# Patient Record
Sex: Female | Born: 1975 | Race: White | Hispanic: No | Marital: Married | Smoking: Never smoker
Health system: Southern US, Community
[De-identification: ages and names within clinical notes are randomized; demographics above are authoritative.]

## PROBLEM LIST (undated history)

## (undated) DIAGNOSIS — R87629 Unspecified abnormal cytological findings in specimens from vagina: Secondary | ICD-10-CM

## (undated) DIAGNOSIS — N8003 Adenomyosis of the uterus: Secondary | ICD-10-CM

## (undated) DIAGNOSIS — N83209 Unspecified ovarian cyst, unspecified side: Secondary | ICD-10-CM

## (undated) DIAGNOSIS — N800 Endometriosis of uterus: Secondary | ICD-10-CM

## (undated) DIAGNOSIS — J45909 Unspecified asthma, uncomplicated: Secondary | ICD-10-CM

## (undated) HISTORY — PX: KNEE SURGERY: SHX244

## (undated) HISTORY — DX: Endometriosis of uterus: N80.0

## (undated) HISTORY — DX: Unspecified abnormal cytological findings in specimens from vagina: R87.629

## (undated) HISTORY — PX: OTHER SURGICAL HISTORY: SHX169

## (undated) HISTORY — DX: Unspecified asthma, uncomplicated: J45.909

## (undated) HISTORY — DX: Adenomyosis of the uterus: N80.03

## (undated) HISTORY — DX: Unspecified ovarian cyst, unspecified side: N83.209

## (undated) HISTORY — PX: LAPAROSCOPIC HYSTERECTOMY: SHX1926

## (undated) HISTORY — PX: TUBAL LIGATION: SHX77

## (undated) HISTORY — PX: ENDOMETRIAL ABLATION: SHX621

---

## 2015-09-16 DIAGNOSIS — G4700 Insomnia, unspecified: Secondary | ICD-10-CM | POA: Insufficient documentation

## 2015-09-16 DIAGNOSIS — F419 Anxiety disorder, unspecified: Secondary | ICD-10-CM | POA: Insufficient documentation

## 2015-09-16 DIAGNOSIS — F458 Other somatoform disorders: Secondary | ICD-10-CM | POA: Insufficient documentation

## 2015-09-16 DIAGNOSIS — G4750 Parasomnia, unspecified: Secondary | ICD-10-CM | POA: Insufficient documentation

## 2015-09-16 DIAGNOSIS — F515 Nightmare disorder: Secondary | ICD-10-CM | POA: Insufficient documentation

## 2015-10-05 DIAGNOSIS — G4733 Obstructive sleep apnea (adult) (pediatric): Secondary | ICD-10-CM | POA: Insufficient documentation

## 2018-03-02 DIAGNOSIS — M7581 Other shoulder lesions, right shoulder: Secondary | ICD-10-CM | POA: Insufficient documentation

## 2020-07-19 ENCOUNTER — Encounter: Payer: Self-pay | Admitting: Obstetrics and Gynecology

## 2020-07-19 ENCOUNTER — Other Ambulatory Visit: Payer: Self-pay

## 2020-07-19 ENCOUNTER — Ambulatory Visit (INDEPENDENT_AMBULATORY_CARE_PROVIDER_SITE_OTHER): Payer: BLUE CROSS/BLUE SHIELD | Admitting: Obstetrics and Gynecology

## 2020-07-19 VITALS — BP 121/69 | HR 78 | Resp 16 | Ht 66.0 in | Wt 182.0 lb

## 2020-07-19 DIAGNOSIS — R102 Pelvic and perineal pain: Secondary | ICD-10-CM

## 2020-07-19 DIAGNOSIS — Z23 Encounter for immunization: Secondary | ICD-10-CM

## 2020-07-19 DIAGNOSIS — Z1231 Encounter for screening mammogram for malignant neoplasm of breast: Secondary | ICD-10-CM

## 2020-07-19 NOTE — Progress Notes (Signed)
GYNECOLOGY OFFICE VISIT NOTE  History:  44 y.o. G3P2010 here today for throbbing in vaginal area for 3 weeks. Comes and goes, has sometimes been so bad to wake her out of sleep. Mostly is a 5/10. Has not tried to take anything for it. Has not seen anyone for it. Never had pain liek this before and this is very different than her cyst pain. Irregular bowel movements, has always had trouble of constipation, no issues with voiding.  Past Medical History:  Diagnosis Date  . Adenomyosis   . Asthma   . Ovarian cyst   . Vaginal Pap smear, abnormal     Past Surgical History:  Procedure Laterality Date  . ENDOMETRIAL ABLATION    . hysterscopy    . KNEE SURGERY    . LAPAROSCOPIC HYSTERECTOMY    . lumpectomy    . TUBAL LIGATION       Current Outpatient Medications:  .  montelukast (SINGULAIR) 10 MG tablet, Take 10 mg by mouth at bedtime., Disp: , Rfl:  .  montelukast (SINGULAIR) 10 MG tablet, Take by mouth., Disp: , Rfl:  .  prazosin (MINIPRESS) 2 MG capsule, Take by mouth., Disp: , Rfl:  .  TRINTELLIX 20 MG TABS tablet, Take 20 mg by mouth daily., Disp: , Rfl:   The following portions of the patient's history were reviewed and updated as appropriate: allergies, current medications, past family history, past medical history, past social history, past surgical history and problem list.   Review of Systems:  Pertinent items noted in HPI and remainder of comprehensive ROS otherwise negative.   Objective:  Physical Exam BP 121/69   Pulse 78   Resp 16   Ht 5\' 6"  (1.676 m)   Wt 182 lb (82.6 kg)   BMI 29.38 kg/m  CONSTITUTIONAL: Well-developed, well-nourished female in no acute distress.  HENT:  Normocephalic, atraumatic. External right and left ear normal. Oropharynx is clear and moist EYES: Conjunctivae and EOM are normal. Pupils are equal, round, and reactive to light. No scleral icterus.  NECK: Normal range of motion, supple, no masses SKIN: Skin is warm and dry. No rash noted.  Not diaphoretic. No erythema. No pallor. NEUROLOGIC: Alert and oriented to person, place, and time. Normal reflexes, muscle tone coordination. No cranial nerve deficit noted. PSYCHIATRIC: Normal mood and affect. Normal behavior. Normal judgment and thought content. CARDIOVASCULAR: Normal heart rate noted RESPIRATORY: Effort normal, no problems with respiration noted ABDOMEN: Soft, no distention noted.   PELVIC: Normal appearing external genitalia; normal appearing vaginal mucosa.  No  Areas of tenderness, no evidence of infection, pt indicates left peri-perineal area with emphasis on more superficial aspect approx 2 cm from introitus at 5 o'clock as where pain is, no palpable areas of tenderness or masses MUSCULOSKELETAL: Normal range of motion. No edema noted.  Exam done with chaperone present.  Labs and Imaging No results found.  Assessment & Plan:  1. Vulvar pain Suspect bartholins cyst that is enlarging and regressing given localization of pain Recommend sitz baths and regular ibuprofen to see if it improves as there is nothing notable on exam - differential includes varicose veins as well, possible congestion in area - if no improvement, will consider imaging  2. Encounter for screening mammogram for malignant neoplasm of breast - MM 3D SCREEN BREAST BILATERAL; Future   Routine preventative health maintenance measures emphasized. Please refer to After Visit Summary for other counseling recommendations.   Return in about 3 months (around 10/18/2020) for annual.  Total face-to-face time with patient: 22 minutes. Over 50% of encounter was spent on counseling and coordination of care.   Baldemar Lenis, M.D. [facility]ending [facility])

## 2020-07-26 ENCOUNTER — Ambulatory Visit (INDEPENDENT_AMBULATORY_CARE_PROVIDER_SITE_OTHER): Payer: BLUE CROSS/BLUE SHIELD

## 2020-07-26 ENCOUNTER — Other Ambulatory Visit: Payer: Self-pay

## 2020-07-26 DIAGNOSIS — Z1231 Encounter for screening mammogram for malignant neoplasm of breast: Secondary | ICD-10-CM | POA: Diagnosis not present

## 2020-08-15 ENCOUNTER — Encounter: Payer: Self-pay | Admitting: *Deleted

## 2020-10-25 ENCOUNTER — Other Ambulatory Visit: Payer: Self-pay

## 2020-10-25 ENCOUNTER — Encounter: Payer: Self-pay | Admitting: Obstetrics and Gynecology

## 2020-10-25 ENCOUNTER — Ambulatory Visit (INDEPENDENT_AMBULATORY_CARE_PROVIDER_SITE_OTHER): Payer: BLUE CROSS/BLUE SHIELD | Admitting: Obstetrics and Gynecology

## 2020-10-25 VITALS — BP 126/87 | HR 71 | Ht 66.0 in | Wt 186.0 lb

## 2020-10-25 DIAGNOSIS — R102 Pelvic and perineal pain: Secondary | ICD-10-CM | POA: Diagnosis not present

## 2020-10-25 DIAGNOSIS — N951 Menopausal and female climacteric states: Secondary | ICD-10-CM | POA: Diagnosis not present

## 2020-10-25 DIAGNOSIS — Z01419 Encounter for gynecological examination (general) (routine) without abnormal findings: Secondary | ICD-10-CM | POA: Diagnosis not present

## 2020-10-25 NOTE — Progress Notes (Signed)
Last mam- 07/26/20- normal

## 2020-10-25 NOTE — Patient Instructions (Signed)
Menopause Menopause is the normal time of life when menstrual periods stop completely. It is usually confirmed by 12 months without a menstrual period. The transition to menopause (perimenopause) most often happens between the ages of 45 and 55. During perimenopause, hormone levels change in your body, which can cause symptoms and affect your health. Menopause may increase your risk for:  Loss of bone (osteoporosis), which causes bone breaks (fractures).  Depression.  Hardening and narrowing of the arteries (atherosclerosis), which can cause heart attacks and strokes. What are the causes? This condition is usually caused by a natural change in hormone levels that happens as you get older. The condition may also be caused by surgery to remove both ovaries (bilateral oophorectomy). What increases the risk? This condition is more likely to start at an earlier age if you have certain medical conditions or treatments, including:  A tumor of the pituitary gland in the brain.  A disease that affects the ovaries and hormone production.  Radiation treatment for cancer.  Certain cancer treatments, such as chemotherapy or hormone (anti-estrogen) therapy.  Heavy smoking and excessive alcohol use.  Family history of early menopause. This condition is also more likely to develop earlier in women who are very thin. What are the signs or symptoms? Symptoms of this condition include:  Hot flashes.  Irregular menstrual periods.  Night sweats.  Changes in feelings about sex. This could be a decrease in sex drive or an increased comfort around your sexuality.  Vaginal dryness and thinning of the vaginal walls. This may cause painful intercourse.  Dryness of the skin and development of wrinkles.  Headaches.  Problems sleeping (insomnia).  Mood swings or irritability.  Memory problems.  Weight gain.  Hair growth on the face and chest.  Bladder infections or problems with urinating. How  is this diagnosed? This condition is diagnosed based on your medical history, a physical exam, your age, your menstrual history, and your symptoms. Hormone tests may also be done. How is this treated? In some cases, no treatment is needed. You and your health care provider should make a decision together about whether treatment is necessary. Treatment will be based on your individual condition and preferences. Treatment for this condition focuses on managing symptoms. Treatment may include:  Menopausal hormone therapy (MHT).  Medicines to treat specific symptoms or complications.  Acupuncture.  Vitamin or herbal supplements. Before starting treatment, make sure to let your health care provider know if you have a personal or family history of:  Heart disease.  Breast cancer.  Blood clots.  Diabetes.  Osteoporosis. Follow these instructions at home: Lifestyle  Do not use any products that contain nicotine or tobacco, such as cigarettes and e-cigarettes. If you need help quitting, ask your health care provider.  Get at least 30 minutes of physical activity on 5 or more days each week.  Avoid alcoholic and caffeinated beverages, as well as spicy foods. This may help prevent hot flashes.  Get 7-8 hours of sleep each night.  If you have hot flashes, try: ? Dressing in layers. ? Avoiding things that may trigger hot flashes, such as spicy food, warm places, or stress. ? Taking slow, deep breaths when a hot flash starts. ? Keeping a fan in your home and office.  Find ways to manage stress, such as deep breathing, meditation, or journaling.  Consider going to group therapy with other women who are having menopause symptoms. Ask your health care provider about recommended group therapy meetings. Eating and   drinking  Eat a healthy, balanced diet that contains whole grains, lean protein, low-fat dairy, and plenty of fruits and vegetables.  Your health care provider may recommend  adding more soy to your diet. Foods that contain soy include tofu, tempeh, and soy milk.  Eat plenty of foods that contain calcium and vitamin D for bone health. Items that are rich in calcium include low-fat milk, yogurt, beans, almonds, sardines, broccoli, and kale. Medicines  Take over-the-counter and prescription medicines only as told by your health care provider.  Talk with your health care provider before starting any herbal supplements. If prescribed, take vitamins and supplements as told by your health care provider. These may include: ? Calcium. Women age 51 and older should get 1,200 mg (milligrams) of calcium every day. ? Vitamin D. Women need 600-800 International Units of vitamin D each day. ? Vitamins B12 and B6. Aim for 50 micrograms of B12 and 1.5 mg of B6 each day. General instructions  Keep track of your menstrual periods, including: ? When they occur. ? How heavy they are and how long they last. ? How much time passes between periods.  Keep track of your symptoms, noting when they start, how often you have them, and how long they last.  Use vaginal lubricants or moisturizers to help with vaginal dryness and improve comfort during sex.  Keep all follow-up visits as told by your health care provider. This is important. This includes any group therapy or counseling. Contact a health care provider if:  You are still having menstrual periods after age 55.  You have pain during sex.  You have not had a period for 12 months and you develop vaginal bleeding. Get help right away if:  You have: ? Severe depression. ? Excessive vaginal bleeding. ? Pain when you urinate. ? A fast or irregular heart beat (palpitations). ? Severe headaches. ? Abdomen (abdominal) pain or severe indigestion.  You fell and you think you have a broken bone.  You develop leg or chest pain.  You develop vision problems.  You feel a lump in your breast. Summary  Menopause is the normal  time of life when menstrual periods stop completely. It is usually confirmed by 12 months without a menstrual period.  The transition to menopause (perimenopause) most often happens between the ages of 45 and 55.  Symptoms can be managed through medicines, lifestyle changes, and complementary therapies such as acupuncture.  Eat a balanced diet that is rich in nutrients to promote bone health and heart health and to manage symptoms during menopause. This information is not intended to replace advice given to you by your health care provider. Make sure you discuss any questions you have with your health care provider. Document Revised: 10/02/2017 Document Reviewed: 11/22/2016 Elsevier Patient Education  2020 Elsevier Inc.  

## 2020-10-25 NOTE — Progress Notes (Signed)
GYNECOLOGY ANNUAL PREVENTATIVE CARE ENCOUNTER NOTE  Subjective:   Suzanne Valdez is a 44 y.o. G64P2010 female here for a annual gynecologic exam. Current complaints: right sided throbbing, 3-4 days out of month, less frequent than before but still present. Vulvar pain improved.    Denies abnormal vaginal bleeding, discharge, problems with intercourse or other gynecologic concerns. Declines STI screen.   Gynecologic History No LMP recorded. Patient has had a hysterectomy. Contraception: status post hysterectomy Last Pap: 2018. Results: normal Last mammogram: 9/21. Results: Birads 1 DEXA: has never had  Obstetric History OB History  Gravida Para Term Preterm AB Living  3 2 2   1     SAB IAB Ectopic Multiple Live Births    1     2    # Outcome Date GA Lbr Len/2nd Weight Sex Delivery Anes PTL Lv  3 IAB           2 Term      Vag-Spont     1 Term      Vag-Spont       Past Medical History:  Diagnosis Date  . Adenomyosis   . Asthma   . Ovarian cyst   . Vaginal Pap smear, abnormal     Past Surgical History:  Procedure Laterality Date  . ENDOMETRIAL ABLATION    . hysterscopy    . KNEE SURGERY    . LAPAROSCOPIC HYSTERECTOMY    . lumpectomy    . TUBAL LIGATION      Current Outpatient Medications on File Prior to Visit  Medication Sig Dispense Refill  . montelukast (SINGULAIR) 10 MG tablet Take 10 mg by mouth at bedtime.    . montelukast (SINGULAIR) 10 MG tablet Take by mouth.    . prazosin (MINIPRESS) 2 MG capsule Take by mouth.    . TRINTELLIX 20 MG TABS tablet Take 20 mg by mouth daily.     No current facility-administered medications on file prior to visit.    Allergies  Allergen Reactions  . Benzoin Other (See Comments) and Rash    unknown unknown unknown unknown unknown unknown unknown unknown   . Latex Rash    ALSO TAPES  ALSO TAPES ALSO TAPES ALSO TAPES ALSO TAPES  ALSO TAPES   . Morphine Other (See Comments) and Rash    Patient sts that it  doesn't help at all with pain Patient sts that it doesn't help at all with pain Patient sts that it doesn't help at all with pain  Patient sts that it doesn't help at all with pain Patient sts that it doesn't help at all with pain Patient sts that it doesn't help at all with pain Patient sts that it doesn't help at all with pain  Patient sts that it doesn't help at all with pain   . Other Other (See Comments) and Rash    Any surgical adhesive  Paper tape, band aids Paper tape, band aids  Any surgical adhesive  Paper tape, band aids Reaction: Blister Paper tape, band aids Reaction: Blister Paper tape, band aids Reaction: Blister Any surgical adhesive  Paper tape, band aids Paper tape, band aids  Any surgical adhesive  Paper tape, band aids Reaction: Blister   . Povidone Iodine Rash  . Silver Rash and Swelling  . Titanium Rash and Swelling  . Lisdexamfetamine Anxiety    And increase in HR.   . Trazodone And Nefazodone Other (See Comments) and Rash    Other reaction(s): Other (See Comments) Other  reaction(s): Confusion     Social History   Socioeconomic History  . Marital status: Married    Spouse name: Not on file  . Number of children: Not on file  . Years of education: Not on file  . Highest education level: Not on file  Occupational History  . Not on file  Tobacco Use  . Smoking status: Never Smoker  . Smokeless tobacco: Never Used  Vaping Use  . Vaping Use: Never used  Substance and Sexual Activity  . Alcohol use: Yes  . Drug use: Not Currently  . Sexual activity: Yes    Partners: Male    Birth control/protection: Surgical  Other Topics Concern  . Not on file  Social History Narrative  . Not on file   Social Determinants of Health   Financial Resource Strain: Not on file  Food Insecurity: Not on file  Transportation Needs: Not on file  Physical Activity: Not on file  Stress: Not on file  Social Connections: Not on file  Intimate Partner  Violence: Not on file    Family History  Problem Relation Age of Onset  . Alcohol abuse Paternal Grandmother   . Lung cancer Paternal Grandmother   . Alcohol abuse Paternal Grandfather   . Diabetes Maternal Grandfather    The following portions of the patient's history were reviewed and updated as appropriate: allergies, current medications, past family history, past medical history, past social history, past surgical history and problem list.  Review of Systems Pertinent items are noted in HPI.   Objective:  BP 126/87   Pulse 71   Ht 5\' 6"  (1.676 m)   Wt 186 lb (84.4 kg)   BMI 30.02 kg/m  CONSTITUTIONAL: Well-developed, well-nourished female in no acute distress.  HENT:  Normocephalic, atraumatic, External right and left ear normal. Oropharynx is clear and moist EYES: Conjunctivae and EOM are normal. Pupils are equal, round, and reactive to light. No scleral icterus.  NECK: Normal range of motion, supple, no masses.  Normal thyroid.  SKIN: Skin is warm and dry. No rash noted. Not diaphoretic. No erythema. No pallor. NEUROLOGIC: Alert and oriented to person, place, and time. Normal reflexes, muscle tone coordination. No cranial nerve deficit noted. PSYCHIATRIC: Normal mood and affect. Normal behavior. Normal judgment and thought content. CARDIOVASCULAR: Normal heart rate noted RESPIRATORY: Effort normal, no problems with respiration noted. BREASTS: deferred ABDOMEN: Soft, o distention noted.  No tenderness, rebound or guarding.  PELVIC: Normal appearing external genitalia; normal appearing vaginal mucosa and cuff.  No abnormal discharge noted.  Uterus surgically absent, right sided adnexal tenderness, no masses palpable, no left sided masses or tenderness MUSCULOSKELETAL: Normal range of motion. No tenderness.  No cyanosis, clubbing, or edema.   Exam done with chaperone present.  Assessment and Plan:   1. Well woman exam Healthy female exam No pap, pt reports she had 3 pap  after hyst (done for adenomyosis) and last two were normal, was told she did not need any further  2. Pelvic pain Left sided tenderness on exam 4-5 days a month of throbbing - check - US PELVIC COMPLETE WITH TRANSVAGINAL; Future  3. Hot flushes, perimenopausal Reviewed menopause, may be starting perimenopause Return if symptoms are affecting quality of life   Encouraged improvement in diet and exercise.  COVID vaccine UTD Declines STI screen. Mammogram UTD Referral for colonoscopy by PCP Flu vaccine declined/UTD DEXA not due based on age  Routine preventative health maintenance measures emphasized. Please refer to After Visit  Summary for other counseling recommendations.     Baldemar Lenis, M.D. [facility]ending [facility])

## 2020-10-29 ENCOUNTER — Other Ambulatory Visit: Payer: Self-pay

## 2020-10-29 ENCOUNTER — Ambulatory Visit (INDEPENDENT_AMBULATORY_CARE_PROVIDER_SITE_OTHER): Payer: BLUE CROSS/BLUE SHIELD

## 2020-10-29 DIAGNOSIS — R102 Pelvic and perineal pain: Secondary | ICD-10-CM

## 2021-09-22 ENCOUNTER — Telehealth: Payer: Self-pay | Admitting: Emergency Medicine

## 2021-09-22 ENCOUNTER — Encounter: Payer: Self-pay | Admitting: Emergency Medicine

## 2021-09-22 ENCOUNTER — Telehealth: Payer: Self-pay

## 2021-09-22 ENCOUNTER — Emergency Department (INDEPENDENT_AMBULATORY_CARE_PROVIDER_SITE_OTHER)
Admission: EM | Admit: 2021-09-22 | Discharge: 2021-09-22 | Disposition: A | Discharge status: Home / Self Care | Payer: BC Managed Care – PPO | Source: Home / Self Care

## 2021-09-22 ENCOUNTER — Other Ambulatory Visit: Payer: Self-pay

## 2021-09-22 DIAGNOSIS — J0100 Acute maxillary sinusitis, unspecified: Secondary | ICD-10-CM | POA: Diagnosis not present

## 2021-09-22 DIAGNOSIS — J3489 Other specified disorders of nose and nasal sinuses: Secondary | ICD-10-CM | POA: Diagnosis not present

## 2021-09-22 MED ORDER — PREDNISONE 20 MG PO TABS: ORAL_TABLET | ORAL | 0 refills | Status: DC

## 2021-09-22 MED ORDER — FLUCONAZOLE 150 MG PO TABS: 150.0000 mg | ORAL_TABLET | ORAL | 0 refills | Status: DC

## 2021-09-22 MED ORDER — CEFDINIR 300 MG PO CAPS: 300.0000 mg | ORAL_CAPSULE | Freq: Two times a day (BID) | ORAL | 0 refills | Status: AC

## 2021-09-22 NOTE — ED Provider Notes (Signed)
[facility]    CSN: [id number] Arrival date & time: 09/22/21  1127      History   Chief Complaint Chief Complaint  Patient presents with   Nasal Congestion    HPI Suzanne Valdez is a 45 y.o. female.   HPI 45 year old female presents with nasal congestion and fatigue for 5 days.  Patient reports testing positive on COVID for COVID-19 on 09/13/2021.  Patient reports asymptomatic while having COVID; however, reports taking pack Paxlovid initially prescribed for her husband..  Reports taking Mucinex and Vicks for current symptoms.  Past Medical History:  Diagnosis Date   Adenomyosis    Asthma    Ovarian cyst    Vaginal Pap smear, abnormal     There are no problems to display for this patient.   Past Surgical History:  Procedure Laterality Date   ENDOMETRIAL ABLATION     hysterscopy     KNEE SURGERY     LAPAROSCOPIC HYSTERECTOMY     lumpectomy     TUBAL LIGATION      OB History     Gravida  3   Para  2   Term  2   Preterm      AB  1   Living         SAB      IAB  1   Ectopic      Multiple      Live Births  2            Home Medications    Prior to Admission medications   Medication Sig Start Date End Date Taking? Authorizing Provider  cefdinir (OMNICEF) 300 MG capsule Take 1 capsule (300 mg total) by mouth 2 (two) times daily for 7 days. 09/22/21 09/29/21 Yes Trevor Iha, FNP  LINZESS 145 MCG CAPS capsule Take 145 mcg by mouth daily. 08/15/21  Yes [provider]  predniSONE (DELTASONE) 20 MG tablet Take 3 tabs PO daily x 5 days. 09/22/21  Yes Trevor Iha, FNP  montelukast (SINGULAIR) 10 MG tablet Take 10 mg by mouth at bedtime. 07/08/20   [provider]  montelukast (SINGULAIR) 10 MG tablet Take by mouth. 01/12/20   [provider]  prazosin (MINIPRESS) 2 MG capsule Take by mouth. 04/19/18   [provider]  TRINTELLIX 20 MG TABS tablet Take 20 mg by mouth daily. 05/01/20   [provider]    Family History Family History  Problem Relation Age of Onset   Alcohol abuse Paternal Grandmother    Lung cancer Paternal Grandmother    Alcohol abuse Paternal Grandfather    Diabetes Maternal Grandfather     Social History Social History   Tobacco Use   Smoking status: Never   Smokeless tobacco: Never  Vaping Use   Vaping Use: Never used  Substance Use Topics   Alcohol use: Yes   Drug use: Not Currently     Allergies   Benzoin, Latex, Morphine, Other, Povidone iodine, Silver, Titanium, Lisdexamfetamine, and Trazodone and nefazodone   Review of Systems Review of Systems  HENT:  Positive for congestion.   Musculoskeletal:  Positive for myalgias.  All other systems reviewed and are negative.   Physical Exam Triage Vital Signs ED Triage Vitals  Enc Vitals Group     BP 09/22/21 1247 123/85     Pulse Rate 09/22/21 1247 69     Resp 09/22/21 1247 16     Temp 09/22/21 1247 98.2 F (36.8 C)  Temp Source 09/22/21 1247 Oral     SpO2 09/22/21 1247 100 %     Weight --      Height --      Head Circumference --      Peak Flow --      Pain Score 09/22/21 1244 1     Pain Loc --      Pain Edu? --      Excl. in GC? --    No data found.  Updated Vital Signs BP 123/85 (BP Location: Right Arm)   Pulse 69   Temp 98.2 F (36.8 C) (Oral)   Resp 16   SpO2 100%    Physical Exam Vitals and nursing note reviewed.  Constitutional:      Appearance: Normal appearance. She is obese.  HENT:     Head: Normocephalic.     Right Ear: Tympanic membrane and external ear normal.     Left Ear: Tympanic membrane and external ear normal.     Ears:     Comments: Moderate eustachian tube dysfunction noted bilaterally    Nose: Congestion present.     Right Sinus: Maxillary sinus tenderness present.     Left Sinus: Maxillary sinus tenderness present.     Comments: Turbinates are erythematous and edematous    Mouth/Throat:     Mouth: Mucous membranes are moist.      Pharynx: Oropharynx is clear.  Eyes:     Extraocular Movements: Extraocular movements intact.     Conjunctiva/sclera: Conjunctivae normal.     Pupils: Pupils are equal, round, and reactive to light.  Cardiovascular:     Rate and Rhythm: Normal rate and regular rhythm.     Pulses: Normal pulses.     Heart sounds: Normal heart sounds.  Pulmonary:     Effort: Pulmonary effort is normal.     Breath sounds: Normal breath sounds.  Musculoskeletal:        General: Normal range of motion.     Cervical back: Normal range of motion and neck supple.  Skin:    General: Skin is warm and dry.  Neurological:     General: No focal deficit present.     Mental Status: She is alert and oriented to person, place, and time.     UC Treatments / Results  Labs (all labs ordered are listed, but only abnormal results are displayed) Labs Reviewed - No data to display  EKG   Radiology No results found.  Procedures Procedures (including critical care time)  Medications Ordered in UC Medications - No data to display  Initial Impression / Assessment and Plan / UC Course  I have reviewed the triage vital signs and the nursing notes.  Pertinent labs & imaging results that were available during my care of the patient were reviewed by me and considered in my medical decision making (see chart for details).     MDM: 1.  Acute maxillary sinusitis-Rx'd Cefdinir; 2.  Sinus pressure-Rx'd prednisone burst. Advised patient to take medication as directed with food to completion.  Advised patient may take Prednisone burst with first dose of Cefdinir for 5 of the next 7 days.  Encouraged patient to increase daily water intake while taking these medications.  Patient discharged home, hemodynamically stable. Final Clinical Impressions(s) / UC Diagnoses   Final diagnoses:  Acute maxillary sinusitis, recurrence not specified  Sinus pressure     Discharge Instructions      Advised patient to take  medication as directed with food  to completion.  Advised patient may take Prednisone burst with first dose of Cefdinir for 5 of the next 7 days.  Encouraged patient to increase daily water intake while taking these medications.     ED Prescriptions     Medication Sig Dispense Auth. Provider   cefdinir (OMNICEF) 300 MG capsule Take 1 capsule (300 mg total) by mouth 2 (two) times daily for 7 days. 14 capsule Trevor Iha, FNP   predniSONE (DELTASONE) 20 MG tablet Take 3 tabs PO daily x 5 days. 15 tablet Trevor Iha, FNP      PDMP not reviewed this encounter.   Trevor Iha, FNP 09/22/21 1415

## 2021-09-22 NOTE — Telephone Encounter (Signed)
Patient was seen today for Sinusitis and was given rx for abx. Patient states she asked for rx for Diflucan. She did not receive the rx of Diflucan at the pharmacy. Can you send a rx for Diflucan to the pharmacy for patient.

## 2021-09-22 NOTE — Discharge Instructions (Addendum)
Advised patient to take medication as directed with food to completion.  Advised patient may take Prednisone burst with first dose of Cefdinir for 5 of the next 7 days.  Encouraged patient to increase daily water intake while taking these medications.

## 2021-09-22 NOTE — ED Triage Notes (Signed)
Patient presents to Urgent Care with complaints of nasal congestion, fatigue since 5 days ago. Patient reports tested positive for Covid on 09/13/21. She was asymptomatic. Now having nasal congestion, fatigued. 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 Taking Mucinex and Vicks for symptoms. Advil and Tylenol.

## 2021-09-22 NOTE — Telephone Encounter (Signed)
Jennings Books, FNP gives new order for Diflucan which is submitted electronically as per his orders. Pt notified.

## 2021-09-29 ENCOUNTER — Emergency Department (INDEPENDENT_AMBULATORY_CARE_PROVIDER_SITE_OTHER)
Admission: EM | Admit: 2021-09-29 | Discharge: 2021-09-29 | Disposition: A | Discharge status: Home / Self Care | Payer: BC Managed Care – PPO | Source: Home / Self Care | Attending: Family Medicine | Admitting: Family Medicine

## 2021-09-29 ENCOUNTER — Other Ambulatory Visit: Payer: Self-pay

## 2021-09-29 DIAGNOSIS — R058 Other specified cough: Secondary | ICD-10-CM | POA: Diagnosis not present

## 2021-09-29 MED ORDER — AEROCHAMBER PLUS FLO-VU MEDIUM MISC
1.0000 | Freq: Once | Status: AC
  Administered 2021-09-29: 10:00:00 1

## 2021-09-29 MED ORDER — ALBUTEROL SULFATE HFA 108 (90 BASE) MCG/ACT IN AERS
1.0000 | INHALATION_SPRAY | Freq: Once | RESPIRATORY_TRACT | Status: AC
  Administered 2021-09-29: 10:00:00 1 via RESPIRATORY_TRACT

## 2021-09-29 MED ORDER — HYDROCOD POLST-CPM POLST ER 10-8 MG/5ML PO SUER: 5.0000 mL | Freq: Two times a day (BID) | ORAL | 0 refills | Status: DC

## 2021-09-29 MED ORDER — PREDNISONE 20 MG PO TABS: 20.0000 mg | ORAL_TABLET | Freq: Two times a day (BID) | ORAL | 0 refills | Status: DC

## 2021-09-29 NOTE — Discharge Instructions (Signed)
Continue the Tessalon Perles 2-3 times a day Add a DM product such as Delsym or Mucinex DM every 12 hours Continue to drink lots of fluids Run a humidifier in your home if you have 1 Take the Tussionex cough medicine for severe coughing spells Use albuterol as needed for coughing spells or shortness of breath Call if not improving by Wednesday

## 2021-09-29 NOTE — ED Provider Notes (Signed)
[facility]    CSN: [id number] Arrival date & time: 09/29/21  3875      History   Chief Complaint Chief Complaint  Patient presents with   Cough   Nasal Congestion    HPI Suzanne Valdez is a 45 y.o. female.   HPI  Patient had COVID.  She had cough and chest congestion and fatigue body aches and fever.  She states that this got somewhat better but she had continuing symptoms so she came in to be seen.  She was diagnosed with sinusitis.  She was given cefdinir, prednisone, and Tessalon.  Prednisone was at 60 mg a day and made her quite irritable.  The cefdinir seemed to make her feel better for a little while but she has cough that is persisting.  She is here today with unremitting cough.  She is exhausted.  She cannot sleep at night.  Her chest hurts from all the coughing.  She feels short of breath.  She does not have underlying lung disease like emphysema or asthma.  Non-smoker.  Is here for assistance with cough control states she is "terrified of pneumonia".  Past Medical History:  Diagnosis Date   Adenomyosis    Asthma    Ovarian cyst    Vaginal Pap smear, abnormal     There are no problems to display for this patient.   Past Surgical History:  Procedure Laterality Date   ENDOMETRIAL ABLATION     hysterscopy     KNEE SURGERY     LAPAROSCOPIC HYSTERECTOMY     lumpectomy     TUBAL LIGATION      OB History     Gravida  3   Para  2   Term  2   Preterm      AB  1   Living         SAB      IAB  1   Ectopic      Multiple      Live Births  2            Home Medications    Prior to Admission medications   Medication Sig Start Date End Date Taking? Authorizing Provider  chlorpheniramine-HYDROcodone (TUSSIONEX PENNKINETIC ER) 10-8 MG/5ML SUER Take 5 mLs by mouth 2 (two) times daily. 09/29/21  Yes Eustace Moore, MD  predniSONE (DELTASONE) 20 MG tablet Take 1 tablet (20 mg total) by mouth 2 (two) times daily with a meal.  09/29/21  Yes Eustace Moore, MD  cefdinir (OMNICEF) 300 MG capsule Take 1 capsule (300 mg total) by mouth 2 (two) times daily for 7 days. 09/22/21 09/29/21  Trevor Iha, FNP  fluconazole (DIFLUCAN) 150 MG tablet Take 1 tablet (150 mg total) by mouth every 3 (three) days. Take one tab if onset of symptoms of yeast infection, then repeat in 3 days. 09/22/21   Trevor Iha, FNP  LINZESS 145 MCG CAPS capsule Take 145 mcg by mouth daily. 08/15/21   [provider]  montelukast (SINGULAIR) 10 MG tablet Take 10 mg by mouth at bedtime. 07/08/20   [provider]  montelukast (SINGULAIR) 10 MG tablet Take by mouth. 01/12/20   [provider]  prazosin (MINIPRESS) 2 MG capsule Take by mouth. 04/19/18   [provider]  predniSONE (DELTASONE) 20 MG tablet Take 3 tabs PO daily x 5 days. 09/22/21   Trevor Iha, FNP  TRINTELLIX 20 MG TABS tablet Take 20 mg by mouth daily. 05/01/20  [provider]    Family History Family History  Problem Relation Age of Onset   Alcohol abuse Paternal Grandmother    Lung cancer Paternal Grandmother    Alcohol abuse Paternal Grandfather    Diabetes Maternal Grandfather     Social History Social History   Tobacco Use   Smoking status: Never   Smokeless tobacco: Never  Vaping Use   Vaping Use: Never used  Substance Use Topics   Alcohol use: Yes   Drug use: Not Currently     Allergies   Benzoin, Latex, Morphine, Other, Povidone iodine, Silver, Titanium, Lisdexamfetamine, and Trazodone and nefazodone   Review of Systems Review of Systems See HPI  Physical Exam Triage Vital Signs ED Triage Vitals  Enc Vitals Group     BP 09/29/21 0833 123/80     Pulse Rate 09/29/21 0833 75     Resp 09/29/21 0833 16     Temp 09/29/21 0833 98.7 F (37.1 C)     Temp Source 09/29/21 0833 Oral     SpO2 09/29/21 0833 98 %     Weight --      Height --      Head Circumference --      Peak Flow --      Pain Score  09/29/21 0837 4     Pain Loc --      Pain Edu? --      Excl. in GC? --    No data found.  Updated Vital Signs BP 123/80 (BP Location: Left Arm)   Pulse 75   Temp 98.7 F (37.1 C) (Oral)   Resp 16   SpO2 98%      Physical Exam Constitutional:      General: She is not in acute distress.    Appearance: She is well-developed. She is ill-appearing.  HENT:     Head: Normocephalic and atraumatic.     Right Ear: Tympanic membrane and ear canal normal.     Left Ear: Tympanic membrane and ear canal normal.     Nose: Congestion present. No rhinorrhea.     Comments: Mild sinus tenderness    Mouth/Throat:     Mouth: Mucous membranes are moist.     Pharynx: No posterior oropharyngeal erythema.  Eyes:     Conjunctiva/sclera: Conjunctivae normal.     Pupils: Pupils are equal, round, and reactive to light.  Cardiovascular:     Rate and Rhythm: Normal rate and regular rhythm.     Heart sounds: Normal heart sounds.  Pulmonary:     Effort: Pulmonary effort is normal. No respiratory distress.     Breath sounds: Rhonchi present.     Comments: Central rhonchi.  Few scattered end inspiratory wheeze. Abdominal:     General: There is no distension.     Palpations: Abdomen is soft.  Musculoskeletal:        General: Normal range of motion.     Cervical back: Normal range of motion.  Skin:    General: Skin is warm and dry.  Neurological:     Mental Status: She is alert.  Psychiatric:        Mood and Affect: Mood normal.        Behavior: Behavior normal.     UC Treatments / Results  Labs (all labs ordered are listed, but only abnormal results are displayed) Labs Reviewed - No data to display  EKG   Radiology No results found.  Procedures Procedures (including critical care time)  Medications Ordered in UC Medications  albuterol (VENTOLIN HFA) 108 (90 Base) MCG/ACT inhaler 1 puff (1 puff Inhalation Given 09/29/21 0931)  AeroChamber Plus Flo-Vu Medium MISC 1 each (1 each Other  Given 09/29/21 0931)    Initial Impression / Assessment and Plan / UC Course  I have reviewed the triage vital signs and the nursing notes.  Pertinent labs & imaging results that were available during my care of the patient were reviewed by me and considered in my medical decision making (see chart for details).     I am diagnosing her withA postviral cough syndrome.  No evidence of infection that an antibiotic will help.  We will give her additional prednisone at a lower dose so it does not cause symptoms.  Continue Tessalon and added dextromethorphan product.  May use Tussionex sparingly at night. We did try an albuterol inhaler here.  Did not help appreciably with the cough but it did reduce the wheezing.  She will use as needed Final Clinical Impressions(s) / UC Diagnoses   Final diagnoses:  Post-viral cough syndrome     Discharge Instructions      Continue the Tessalon Perles 2-3 times a day Add a DM product such as Delsym or Mucinex DM every 12 hours Continue to drink lots of fluids Run a humidifier in your home if you have 1 Take the Tussionex cough medicine for severe coughing spells Use albuterol as needed for coughing spells or shortness of breath Call if not improving by Wednesday   ED Prescriptions     Medication Sig Dispense Auth. Provider   predniSONE (DELTASONE) 20 MG tablet Take 1 tablet (20 mg total) by mouth 2 (two) times daily with a meal. 10 tablet Eustace Moore, MD   chlorpheniramine-HYDROcodone [facility] PENNKINETIC ER) 10-8 MG/5ML SUER Take 5 mLs by mouth 2 (two) times daily. 140 mL Eustace Moore, MD      PDMP not reviewed this encounter.   Eustace Moore, MD 09/29/21 1200

## 2021-09-29 NOTE — ED Triage Notes (Signed)
Pt present nasal congestion with coughing severely. Pt states coughing and congestion has gotten worst since visit on 11/20.

## 2021-10-05 IMAGING — MG DIGITAL SCREENING BILAT W/ TOMO W/ CAD
8 series · 8 of 24 positions shown · non-contrast
Comparison: Previous exam(s).

CLINICAL DATA: Screening.

EXAM:
DIGITAL SCREENING BILATERAL MAMMOGRAM WITH TOMO AND CAD

[L CC synth-2D]
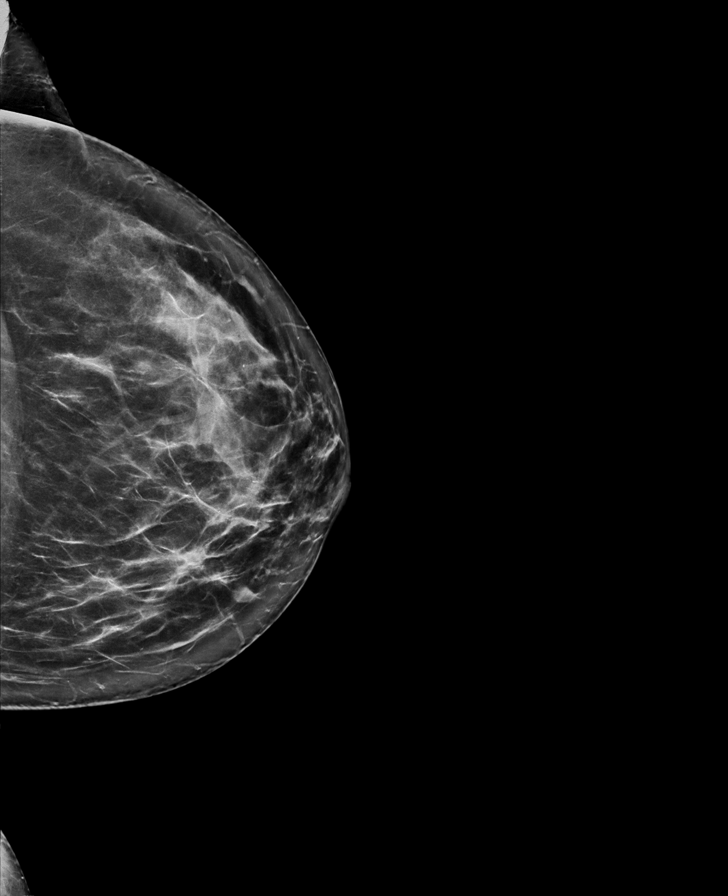

[R CC synth-2D]
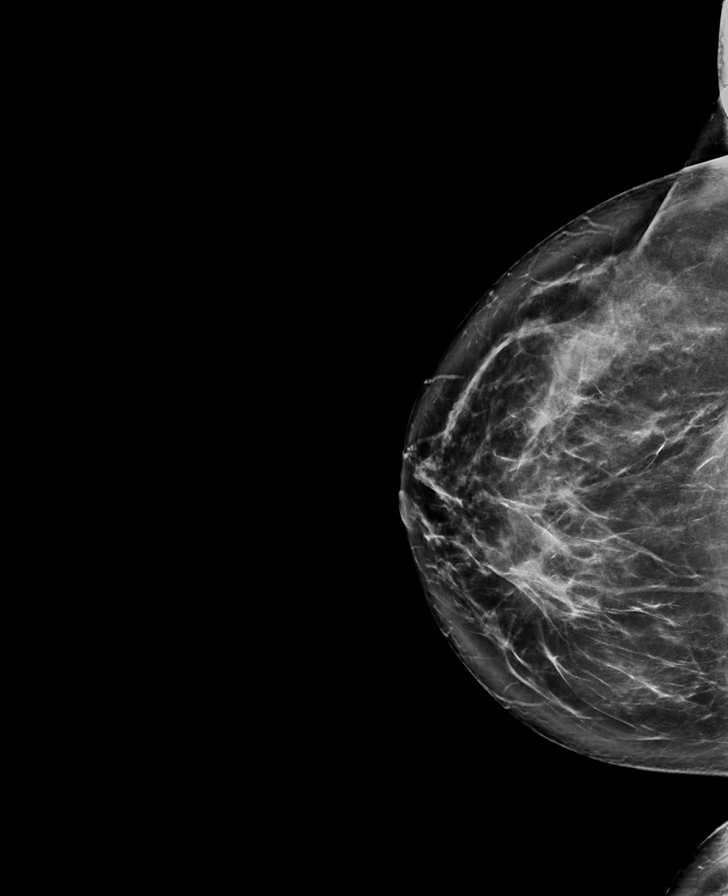

[R MLO synth-2D]
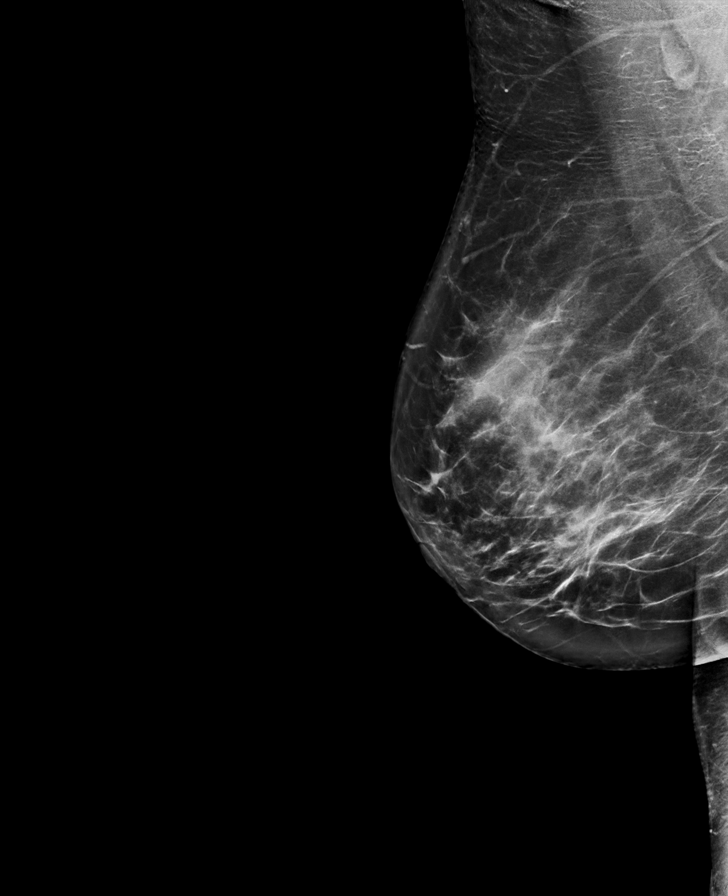

[L MLO synth-2D]
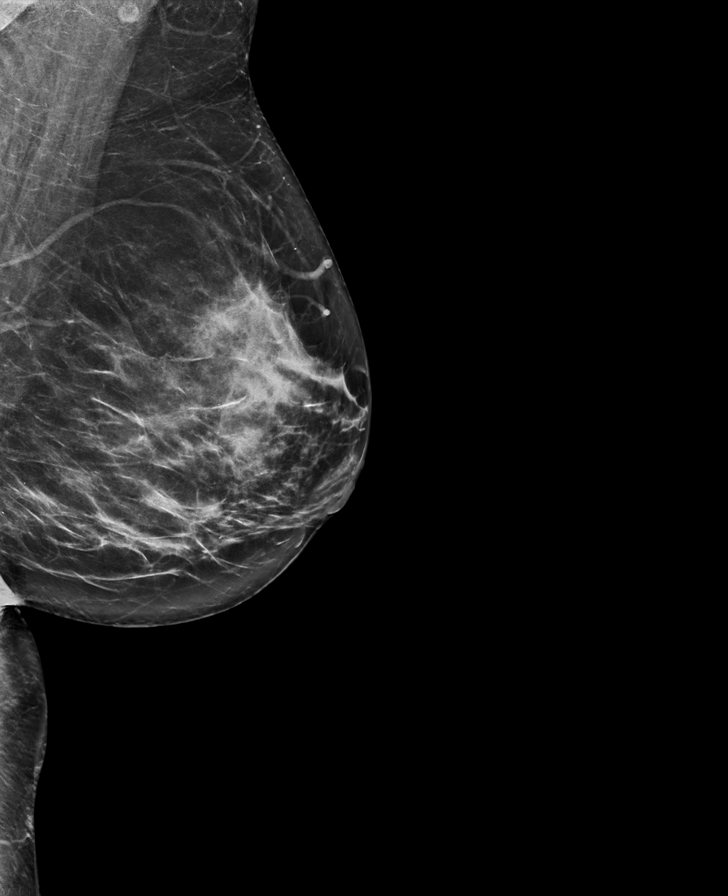

[L CC tomo · tomo slice 40/79.0]
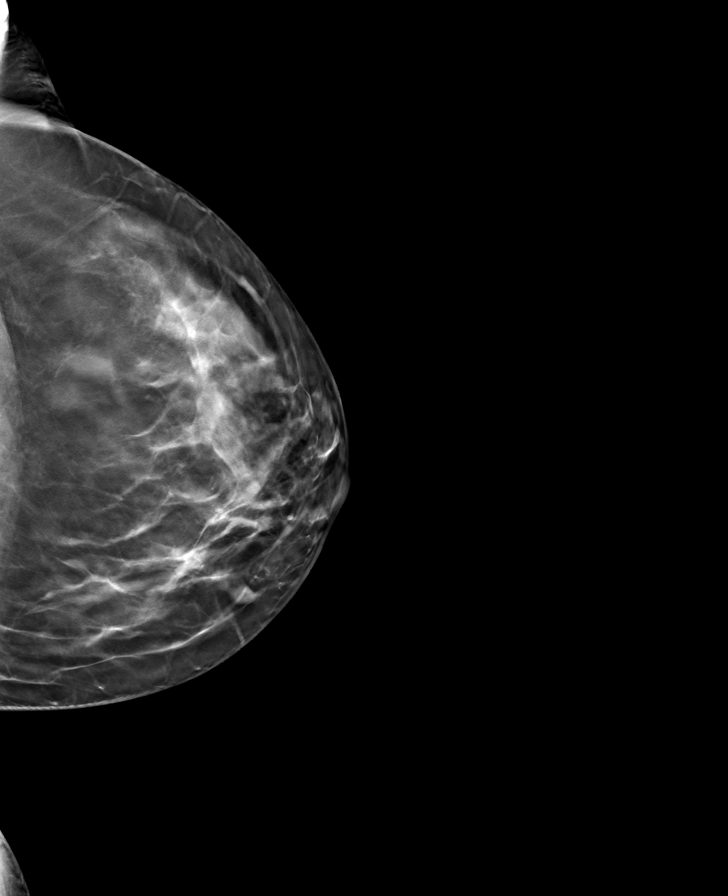

[L MLO tomo · tomo slice 38/75.0]
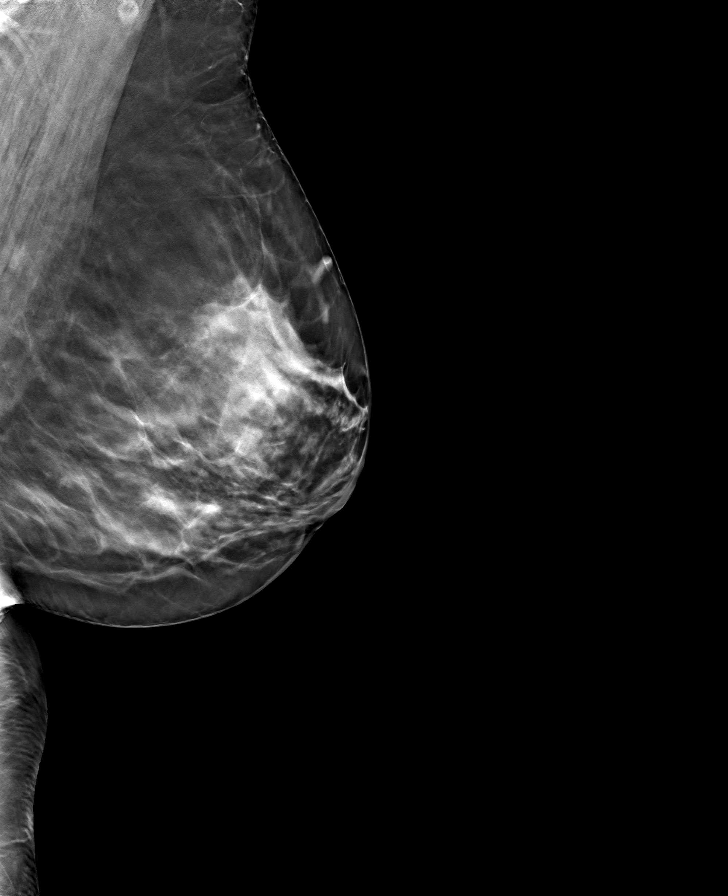

[R CC tomo · tomo slice 43/84.0]
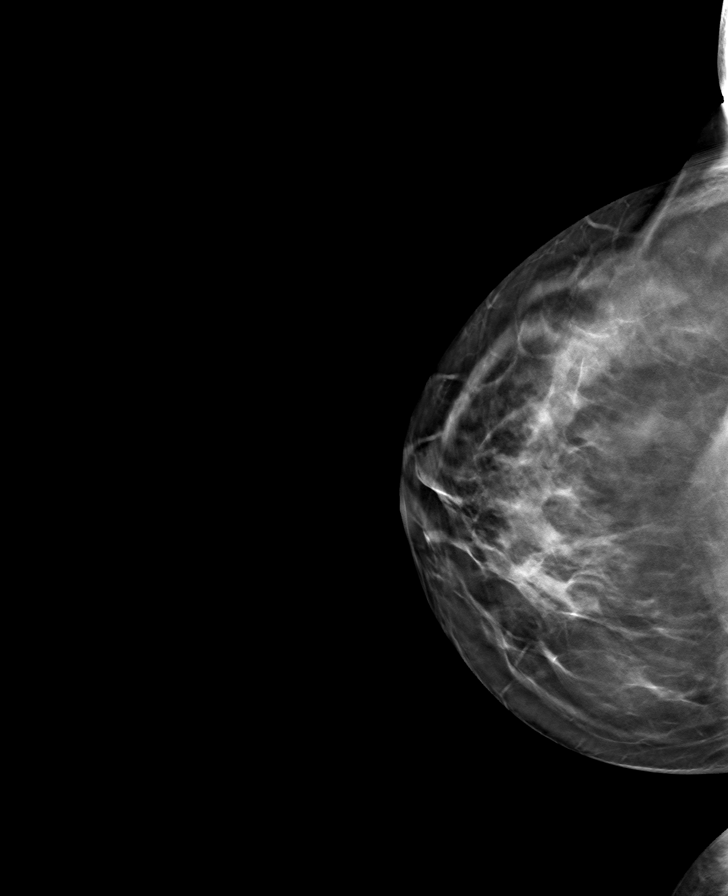

[R MLO tomo · tomo slice 45/88.0]
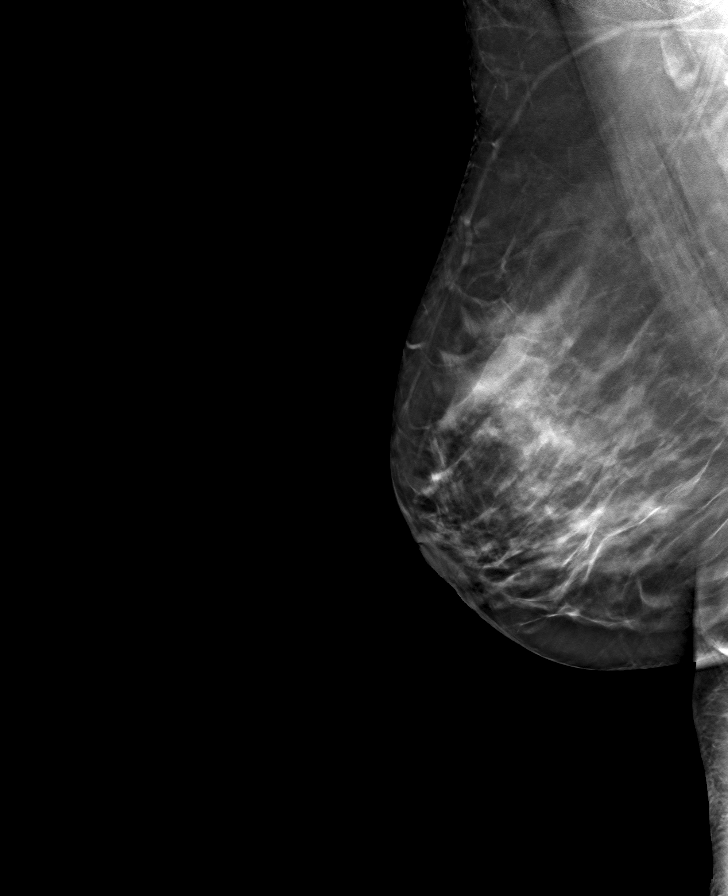

[8 of 24 positions shown; findings below may reference images not displayed]

ACR Breast Density Category c: The breast tissue is heterogeneously
dense, which may obscure small masses.
FINDINGS: There are no findings suspicious for malignancy. Images were
processed with CAD.
IMPRESSION: No mammographic evidence of malignancy. A result letter of this
screening mammogram will be mailed directly to the patient.

RECOMMENDATION:
Screening mammogram in one year. (Code:FT-U-LHB)

BI-RADS CATEGORY  1: Negative.

## 2021-10-30 NOTE — Progress Notes (Signed)
GYNECOLOGY ANNUAL PREVENTATIVE CARE ENCOUNTER NOTE  History:     Suzanne Valdez is a 45 y.o. [id number] female here for a routine annual gynecologic exam.    Current complaints: occasional twinges on her right side, she thinks maybe when she ovulates and has a cyst.   Denies abnormal vaginal bleeding, discharge, pelvic pain, problems with intercourse or other gynecologic concerns.     Gynecologic History No LMP recorded. Patient has had a hysterectomy. Contraception: status post hysterectomy Last Pap: 2018. Results: normal Last mammogram: 9/21. Results: Birads 1 DEXA: has never had  Obstetric History OB History  Gravida Para Term Preterm AB Living  3 2 2   1     SAB IAB Ectopic Multiple Live Births    1     2    # Outcome Date GA Lbr Len/2nd Weight Sex Delivery Anes PTL Lv  3 IAB           2 Term      Vag-Spont     1 Term      Vag-Spont       Past Medical History:  Diagnosis Date   Adenomyosis    Asthma    Ovarian cyst    Vaginal Pap smear, abnormal     Past Surgical History:  Procedure Laterality Date   ENDOMETRIAL ABLATION     hysterscopy     KNEE SURGERY     LAPAROSCOPIC HYSTERECTOMY     lumpectomy     TUBAL LIGATION      Current Outpatient Medications on File Prior to Visit  Medication Sig Dispense Refill   clonazepam (KLONOPIN) 2 MG disintegrating tablet TAKE 1 TABLET AN HOUR BEFORE BED. ALTERNATE WITH LUNESTA. DO NOT TAKE TOGETHER.     fluticasone (FLONASE) 50 MCG/ACT nasal spray Place into the nose.     fluticasone furoate-vilanterol (BREO ELLIPTA) 200-25 MCG/ACT AEPB      Lemborexant (DAYVIGO) 5 MG TABS Take by mouth.     chlorpheniramine-HYDROcodone (TUSSIONEX PENNKINETIC ER) 10-8 MG/5ML SUER Take 5 mLs by mouth 2 (two) times daily. 140 mL 0   LINZESS 145 MCG CAPS capsule Take 145 mcg by mouth daily.     montelukast (SINGULAIR) 10 MG tablet Take 10 mg by mouth at bedtime.     prazosin (MINIPRESS) 2 MG capsule Take by mouth.     predniSONE  (DELTASONE) 20 MG tablet Take 3 tabs PO daily x 5 days. 15 tablet 0   TRINTELLIX 20 MG TABS tablet Take 20 mg by mouth daily.     No current facility-administered medications on file prior to visit.    Allergies  Allergen Reactions   Benzoin Other (See Comments) and Rash    unknown unknown unknown unknown unknown unknown unknown unknown    Latex Rash    ALSO TAPES  ALSO TAPES ALSO TAPES ALSO TAPES ALSO TAPES  ALSO TAPES    Morphine Other (See Comments) and Rash    Patient sts that it doesn't help at all with pain Patient sts that it doesn't help at all with pain Patient sts that it doesn't help at all with pain  Patient sts that it doesn't help at all with pain Patient sts that it doesn't help at all with pain Patient sts that it doesn't help at all with pain Patient sts that it doesn't help at all with pain  Patient sts that it doesn't help at all with pain    Other Other (See Comments) and Rash  Any surgical adhesive  Paper tape, band aids Paper tape, band aids  Any surgical adhesive  Paper tape, band aids Reaction: Blister Paper tape, band aids Reaction: Blister Paper tape, band aids Reaction: Blister Any surgical adhesive  Paper tape, band aids Paper tape, band aids  Any surgical adhesive  Paper tape, band aids Reaction: Blister    Povidone Iodine Rash   Silver Rash and Swelling   Titanium Rash and Swelling   Lisdexamfetamine Anxiety    And increase in HR.    Trazodone And Nefazodone Other (See Comments) and Rash    Other reaction(s): Other (See Comments) Other reaction(s): Confusion     Social History:  reports that she has never smoked. She has never used smokeless tobacco. She reports current alcohol use. She reports that she does not currently use drugs.  Family History  Problem Relation Age of Onset   Alcohol abuse Paternal Grandmother    Lung cancer Paternal Grandmother    Alcohol abuse Paternal Grandfather    Diabetes Maternal  Grandfather     The following portions of the patient's history were reviewed and updated as appropriate: allergies, current medications, past family history, past medical history, past social history, past surgical history and problem list.  Review of Systems Pertinent items noted in HPI and remainder of comprehensive ROS otherwise negative.  Physical Exam:  There were no vitals taken for this visit. CONSTITUTIONAL: Well-developed, well-nourished female in no acute distress.  HENT:  Normocephalic, atraumatic, External right and left ear normal.  EYES: Conjunctivae and EOM are normal. Pupils are equal, round, and reactive to light. No scleral icterus.  NECK: Normal range of motion, supple, no masses.  Normal thyroid.  SKIN: Skin is warm and dry. No rash noted. Not diaphoretic. No erythema. No pallor. MUSCULOSKELETAL: Normal range of motion. No tenderness.  No cyanosis, clubbing, or edema. NEUROLOGIC: Alert and oriented to person, place, and time. Normal reflexes, muscle tone coordination.  PSYCHIATRIC: Normal mood and affect. Normal behavior. Normal judgment and thought content.  CARDIOVASCULAR: Normal heart rate noted, regular rhythm RESPIRATORY: Clear to auscultation bilaterally. Effort and breath sounds normal, no problems with respiration noted.  BREASTS: Symmetric in size. No masses, tenderness, skin changes, nipple drainage, or lymphadenopathy bilaterally. Performed in the presence of a chaperone. ABDOMEN: Soft, no distention noted.  No tenderness, rebound or guarding.  PELVIC: External genitalia normal, Vagina normal without discharge, Urethra without abnormality or discharge, no bladder tenderness, no adnexal masses or tenderness, uterus surgically absent. Performed in the presence of a chaperone.  Assessment and Plan:    1. Encounter for annual routine gynecological examination - Cervical cancer screening: Discussed guidelines. Normal paps now s/p hyst - STD Testing: not  indicated - Breast Health: Encouraged self breast awareness/SBE. Teaching provided. Discussed limits of clinical breast exam for detecting breast cancer. Rx given for MXR - F/U 12 months and prn  Routine preventative health maintenance measures emphasized. Please refer to After Visit Summary for other counseling recommendations.   Milas Hock, MD, FACOG Obstetrician & Gynecologist, [facility], [facility]

## 2021-10-31 ENCOUNTER — Ambulatory Visit (INDEPENDENT_AMBULATORY_CARE_PROVIDER_SITE_OTHER): Payer: BC Managed Care – PPO | Admitting: Obstetrics and Gynecology

## 2021-10-31 ENCOUNTER — Other Ambulatory Visit: Payer: Self-pay

## 2021-10-31 ENCOUNTER — Encounter: Payer: Self-pay | Admitting: Obstetrics and Gynecology

## 2021-10-31 VITALS — BP 133/73 | HR 73 | Ht 66.0 in | Wt 178.0 lb

## 2021-10-31 DIAGNOSIS — Z01419 Encounter for gynecological examination (general) (routine) without abnormal findings: Secondary | ICD-10-CM | POA: Diagnosis not present

## 2021-12-08 ENCOUNTER — Other Ambulatory Visit: Payer: Self-pay

## 2021-12-08 ENCOUNTER — Emergency Department (INDEPENDENT_AMBULATORY_CARE_PROVIDER_SITE_OTHER)
Admission: EM | Admit: 2021-12-08 | Discharge: 2021-12-08 | Disposition: A | Discharge status: Home / Self Care | Payer: BC Managed Care – PPO | Source: Home / Self Care | Attending: Family Medicine | Admitting: Family Medicine

## 2021-12-08 ENCOUNTER — Encounter: Payer: Self-pay | Admitting: Emergency Medicine

## 2021-12-08 DIAGNOSIS — Z20822 Contact with and (suspected) exposure to covid-19: Secondary | ICD-10-CM

## 2021-12-08 LAB — POC SARS CORONAVIRUS 2 AG -  ED: SARS Coronavirus 2 Ag: NEGATIVE

## 2021-12-08 NOTE — ED Provider Notes (Signed)
[facility]    CSN: [id number] Arrival date & time: 12/08/21  0936      History   Chief Complaint Chief Complaint  Patient presents with   Covid Exposure    HPI Suzanne Valdez is a 46 y.o. female.   HPI Patient states that she had COVID in November.  She has been well since then.  She states that she did spend some time this week with someone who subsequently ended up with COVID.  She feels like she had a close exposure.  She has been feeling more tired.  She did do a COVID test which showed a faint positive.  The test was expired.  She would like to have this repeated.  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 Authorizing Provider  doxycycline (VIBRA-TABS) 100 MG tablet Take 100 mg by mouth daily. 12/08/21  Yes [provider]  fluocinonide (LIDEX) 0.05 % external solution Apply topically. 07/31/21  Yes [provider]  L-Methylfolate-Algae (DEPLIN 15 PO) Take by mouth.   Yes [provider]  LINZESS 145 MCG CAPS capsule Take 145 mcg by mouth daily. 08/15/21  Yes  [provider]  prazosin (MINIPRESS) 2 MG capsule Take by mouth. 04/19/18  Yes [provider]  ramelteon (ROZEREM) 8 MG tablet TAKE ONE TABLET BY MOUTH 30 MINUTES BEFORE BEDTIME AS NEEDED FOR SLEEP 11/27/21  Yes [provider]    Family History Family History  Problem Relation Age of Onset   Alcohol abuse Paternal Grandmother    Lung cancer Paternal Grandmother    Alcohol abuse Paternal Grandfather    Diabetes Maternal Grandfather     Social History Social History   Tobacco Use   Smoking status: Never   Smokeless tobacco: Never  Vaping Use   Vaping Use: Never used  Substance Use Topics   Alcohol use: Yes   Drug use: Not Currently     Allergies   Benzoin, Latex, Morphine, Other, Povidone iodine, Silver, Titanium, Lisdexamfetamine, and Trazodone and nefazodone   Review of Systems Review of Systems See HPI  Physical Exam Triage Vital Signs ED Triage Vitals  Enc Vitals Group     BP 12/08/21  1020 110/75     Pulse Rate 12/08/21 1020 60     Resp 12/08/21 1020 16     Temp --      Temp Source 12/08/21 1020 Oral     SpO2 12/08/21 1020 99 %     Weight --      Height --      Head Circumference --      Peak Flow --      Pain Score 12/08/21 1016 0     Pain Loc --      Pain Edu? --      Excl. in GC? --    No data found.  Updated Vital Signs BP 110/75 (BP Location: Right Arm)    Pulse 60    Temp 99.1 F (37.3 C) (Oral)    Resp 16    SpO2 99%      Physical Exam Constitutional:      General: She is not in acute distress.    Appearance: She is well-developed.  HENT:     Head: Normocephalic and atraumatic.  Eyes:     Conjunctiva/sclera: Conjunctivae normal.     Pupils: Pupils are equal, round, and reactive to light.  Cardiovascular:     Rate and Rhythm: Normal rate.  Pulmonary:     Effort: Pulmonary effort is normal. No respiratory distress.     Breath sounds: Normal breath sounds.  Abdominal:     General: There is no distension.      Palpations: Abdomen is soft.  Musculoskeletal:        General: Normal range of motion.     Cervical back: Normal range of motion.  Skin:    General: Skin is warm and dry.  Neurological:     Mental Status: She is alert.  Psychiatric:        Mood and Affect: Mood normal.        Behavior: Behavior normal.     UC Treatments / Results  Labs (all labs ordered are listed, but only abnormal results are displayed) Labs Reviewed  POC SARS CORONAVIRUS 2 AG -  ED - Normal    EKG   Radiology No results found.  Procedures Procedures (including critical care time)  Medications Ordered in UC Medications - No data to display  Initial Impression / Assessment and Plan / UC Course  I have reviewed the triage vital signs and the nursing notes.  Pertinent labs & imaging results that were available during my care of the patient were reviewed by me and considered in my medical decision making (see chart for details).     COVID test is negative.  Patient will follow-up with her primary care doctor if the fatigue persists Final Clinical Impressions(s) / UC Diagnoses   Final diagnoses:  Close exposure to COVID-19 virus     Discharge Instructions      Return as needed     ED Prescriptions   None    PDMP not reviewed this encounter.   Eustace Moore, MD 12/08/21 [phone]

## 2021-12-08 NOTE — ED Triage Notes (Signed)
Patient presents to Urgent Care with complaints of exposed to covid this week. 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 The test was expired. Having increased fatigued. Had covid in early Nov.

## 2021-12-08 NOTE — Discharge Instructions (Signed)
Return as needed

## 2022-07-31 ENCOUNTER — Ambulatory Visit (INDEPENDENT_AMBULATORY_CARE_PROVIDER_SITE_OTHER): Payer: BC Managed Care – PPO | Admitting: Obstetrics and Gynecology

## 2022-07-31 ENCOUNTER — Encounter: Payer: Self-pay | Admitting: Obstetrics and Gynecology

## 2022-07-31 VITALS — BP 116/74 | HR 75 | Wt 183.0 lb

## 2022-07-31 DIAGNOSIS — N816 Rectocele: Secondary | ICD-10-CM | POA: Diagnosis not present

## 2022-07-31 DIAGNOSIS — K5900 Constipation, unspecified: Secondary | ICD-10-CM

## 2022-07-31 NOTE — Progress Notes (Signed)
   GYNECOLOGY OFFICE VISIT NOTE  History:   Suzanne Valdez is a 46 y.o. [id number] here today for evaluation for possible prolapse.   She has longstanding refractory constipation for which she has been following with PCP for. She is s/p L/S hysterectomy.   SUI has improved s/p PFPT.   She has had issues for the last several years with orgasm. (Not able to have).   She also had a single episode of bleeding with wiping recently. It was not ongoing. No associated event or symptoms.   Main issue today though is evaluation for constipation and possible prolapse.    Past Medical History:  Diagnosis Date   Adenomyosis    Asthma    Ovarian cyst    Vaginal Pap smear, abnormal     Past Surgical History:  Procedure Laterality Date   ENDOMETRIAL ABLATION     hysterscopy     KNEE SURGERY     LAPAROSCOPIC HYSTERECTOMY     lumpectomy     TUBAL LIGATION      The following portions of the patient's history were reviewed and updated as appropriate: allergies, current medications, past family history, past medical history, past social history, past surgical history and problem list.   Review of Systems:  Pertinent items noted in HPI and remainder of comprehensive ROS otherwise negative.  Physical Exam:  BP 116/74   Pulse 75   Wt 183 lb (83 kg)   BMI 29.54 kg/m  CONSTITUTIONAL: Well-developed, well-nourished female in no acute distress.  HEENT:  Normocephalic, atraumatic. External right and left ear normal. No scleral icterus.  NECK: Normal range of motion, supple, no masses noted on observation SKIN: No rash noted. Not diaphoretic. No erythema. No pallor. MUSCULOSKELETAL: Normal range of motion. No edema noted. NEUROLOGIC: Alert and oriented to person, place, and time. Normal muscle tone coordination. No cranial nerve deficit noted. PSYCHIATRIC: Normal mood and affect. Normal behavior. Normal judgment and thought content.  CARDIOVASCULAR: Normal heart rate noted RESPIRATORY: Effort and  breath sounds normal, no problems with respiration noted ABDOMEN: No masses noted. No other overt distention noted.    PELVIC: Normal appearing external genitalia; normal urethral meatus; normal appearing vaginal mucosa. Mild posterior vaginal wall prolapse with vasalva. Muscles strength is normal. Prolapse c/w Stage 2 with valsalve  No abnormal discharge noted.  Performed in the presence of a chaperone  Labs and Imaging No results found for this or any previous visit (from the past 168 hour(s)). No results found.  Assessment and Plan:   Suzanne Valdez was seen today for possible prolapse.  Diagnoses and all orders for this visit:  Constipation, unspecified constipation type - Discussed seeing GI specialist - Try peppermint oil - Continue current bowel regimen - to start trulance soon.   Posterior vaginal wall prolapse - Reviewed possible that prolapse is making the constipation worse but not what I would anticipate is causing it. Still she can do PFPT (she would like to find home exercises). We discussed surgery does exist to help but that constipation can undo the work of fixing the prolapse if constipation is not fixed first.    Routine preventative health maintenance measures emphasized. Please refer to After Visit Summary for other counseling recommendations.   Return if symptoms worsen or fail to improve.  Suzanne Gunning, MD, [place] [facility], [place]

## 2022-07-31 NOTE — Patient Instructions (Signed)
Peppermint Oil Capsules:  1 capsule, taken 3 times a day until your symptoms get better.

## 2023-02-07 ENCOUNTER — Ambulatory Visit
Admission: RE | Admit: 2023-02-07 | Discharge: 2023-02-07 | Disposition: A | Discharge status: Home / Self Care | Payer: Managed Care, Other (non HMO) | Source: Ambulatory Visit

## 2023-02-07 ENCOUNTER — Ambulatory Visit: Payer: Self-pay

## 2023-02-07 ENCOUNTER — Ambulatory Visit (INDEPENDENT_AMBULATORY_CARE_PROVIDER_SITE_OTHER): Payer: Managed Care, Other (non HMO)

## 2023-02-07 ENCOUNTER — Other Ambulatory Visit: Payer: Self-pay

## 2023-02-07 VITALS — BP 111/80 | HR 88 | Temp 99.3°F | Resp 16 | Ht 66.0 in | Wt 170.0 lb

## 2023-02-07 DIAGNOSIS — R059 Cough, unspecified: Secondary | ICD-10-CM | POA: Diagnosis not present

## 2023-02-07 DIAGNOSIS — R053 Chronic cough: Secondary | ICD-10-CM | POA: Diagnosis not present

## 2023-02-07 DIAGNOSIS — J40 Bronchitis, not specified as acute or chronic: Secondary | ICD-10-CM | POA: Diagnosis not present

## 2023-02-07 DIAGNOSIS — J309 Allergic rhinitis, unspecified: Secondary | ICD-10-CM

## 2023-02-07 MED ORDER — BENZONATATE 200 MG PO CAPS: 200.0000 mg | ORAL_CAPSULE | Freq: Three times a day (TID) | ORAL | 0 refills | Status: AC | PRN

## 2023-02-07 MED ORDER — PREDNISONE 10 MG (21) PO TBPK: ORAL_TABLET | Freq: Every day | ORAL | 0 refills | Status: AC

## 2023-02-07 MED ORDER — PROMETHAZINE-DM 6.25-15 MG/5ML PO SYRP
5.0000 mL | ORAL_SOLUTION | Freq: Two times a day (BID) | ORAL | 0 refills | Status: AC | PRN
Start: 2023-02-07 — End: ?

## 2023-02-07 MED ORDER — DOXYCYCLINE HYCLATE 100 MG PO CAPS: 100.0000 mg | ORAL_CAPSULE | Freq: Two times a day (BID) | ORAL | 0 refills | Status: AC

## 2023-02-07 MED ORDER — FEXOFENADINE HCL 180 MG PO TABS: 180.0000 mg | ORAL_TABLET | Freq: Every day | ORAL | 0 refills | Status: AC

## 2023-02-07 NOTE — ED Provider Notes (Signed)
[facility]    CSN: [id number] Arrival date & time: 02/07/23  0915      History   Chief Complaint Chief Complaint  Patient presents with   Cough    HPI Suzanne Valdez is a 47 y.o. female.   HPI 72-YEAR-OLD FEMALE PRESENTS WITH COUGH FOR 5 DAYS REPORTS BEING EVALUATED BY PCP ON 02/02/2023.  PMH significant for obesity, anxiety, nightmare disorder, mild OSA, and asthma.  Patient recently evaluated at [facility] on 12/12/2022 and prescribed Zithromax Tessalon and prednisone.  Patient believes this may have helped her then.  Reports cough is so harsh and severe at times that is causing her to urinate and vomit.  Patient reports taking OTC Alka-Seltzer cold and sinus with Flonase with little to no relief.  Reports PCP started on Advair discus inhaler on 02/02/2023.  Past Medical History:  Diagnosis Date   Adenomyosis    Asthma    Ovarian cyst    Vaginal Pap smear, abnormal     Patient Active Problem List   Diagnosis Date Noted   Right rotator cuff tendinitis 03/02/2018   Mild obstructive sleep apnea 10/05/2015   Anxiety 09/16/2015   Bruxism 09/16/2015   Insomnia 09/16/2015   Nightmare disorder 09/16/2015   Parasomnia 09/16/2015    Past Surgical History:  Procedure Laterality Date   ENDOMETRIAL ABLATION     hysterscopy     KNEE SURGERY     LAPAROSCOPIC HYSTERECTOMY     lumpectomy     TUBAL LIGATION      OB History     Gravida  3   Para  2   Term  2   Preterm      AB  1   Living         SAB      IAB  1   Ectopic      Multiple      Live Births  2            Home Medications    Prior to Admission medications   Medication Sig Start Date End Date Taking? Authorizing Provider  albuterol (VENTOLIN HFA) 108 (90 Base) MCG/ACT inhaler Inhale into the lungs. 04/12/19  Yes [provider]  BELSOMRA 15 MG TABS Take by mouth. 12/11/22  Yes [provider]  benzonatate (TESSALON) 200 MG capsule Take 1 capsule (200 mg total) by  mouth 3 (three) times daily as needed for up to 7 days. 02/07/23 02/14/23 Yes Trevor Iha, FNP  doxycycline (VIBRAMYCIN) 100 MG capsule Take 1 capsule (100 mg total) by mouth 2 (two) times daily for 10 days. 02/07/23 02/17/23 Yes Trevor Iha, FNP  fexofenadine [facility] ALLERGY) 180 MG tablet Take 1 tablet (180 mg total) by mouth daily for 15 days. 02/07/23 02/22/23 Yes Trevor Iha, FNP  MAGNESIUM PO Take by mouth. 12/02/21  Yes [provider]  montelukast (SINGULAIR) 10 MG tablet Take by mouth. 04/15/22  Yes [provider]  predniSONE (STERAPRED UNI-PAK 21 TAB) 10 MG (21) TBPK tablet Take by mouth daily. Take 6 tabs by mouth daily  for 2 days, then 5 tabs for 2 days, then 4 tabs for 2 days, then 3 tabs for 2 days, 2 tabs for 2 days, then 1 tab by mouth daily for 2 days 02/07/23  Yes Trevor Iha, FNP  promethazine-dextromethorphan (PROMETHAZINE-DM) 6.25-15 MG/5ML syrup Take 5 mLs by mouth 2 (two) times daily as needed for cough. 02/07/23  Yes Trevor Iha, FNP  propranolol (INDERAL) 10  MG tablet TAKE 1 TABLET BY MOUTH TWICE A DAY AS NEEDED FOR ANXIETY 09/19/22  Yes [provider]  doxepin (SINEQUAN) 10 MG capsule Take by mouth. 02/19/22   [provider]  ramelteon (ROZEREM) 8 MG tablet  11/27/21   [provider]  TRINTELLIX 10 MG TABS tablet Take 10 mg by mouth daily. 05/15/22   [provider]    Family History Family History  Problem Relation Age of Onset   Diabetes Maternal Grandfather    Alcohol abuse Paternal Grandmother    Lung cancer Paternal Grandmother    Alcohol abuse Paternal Grandfather     Social History Social History   Tobacco Use   Smoking status: Never   Smokeless tobacco: Never  Vaping Use   Vaping Use: Never used  Substance Use Topics   Alcohol use: Yes    Alcohol/week: 2.0 standard drinks of alcohol    Types: 2 Standard drinks or equivalent per week   Drug use: Not Currently     Allergies   Benzoin,  Latex, Morphine, Other, Povidone iodine, Silver, Titanium, Lisdexamfetamine, and Trazodone and nefazodone   Review of Systems Review of Systems  Respiratory:  Positive for cough.   Gastrointestinal:  Positive for vomiting.  All other systems reviewed and are negative.    Physical Exam Triage Vital Signs ED Triage Vitals  Enc Vitals Group     BP 02/07/23 0929 111/80     Pulse Rate 02/07/23 0929 88     Resp 02/07/23 0929 16     Temp 02/07/23 0929 99.3 F (37.4 C)     Temp Source 02/07/23 0929 Oral     SpO2 02/07/23 0929 97 %     Weight 02/07/23 0931 170 lb (77.1 kg)     Height 02/07/23 0931 5\' 6"  (1.676 m)     Head Circumference --      Peak Flow --      Pain Score 02/07/23 0931 0     Pain Loc --      Pain Edu? --      Excl. in GC? --    No data found.  Updated Vital Signs BP 111/80 (BP Location: Left Arm)   Pulse 88   Temp 99.3 F (37.4 C) (Oral)   Resp 16   Ht 5\' 6"  (1.676 m)   Wt 170 lb (77.1 kg)   SpO2 97%   BMI 27.44 kg/m     Physical Exam Vitals and nursing note reviewed.  Constitutional:      Appearance: Normal appearance. She is obese. She is ill-appearing.  HENT:     Head: Normocephalic and atraumatic.     Right Ear: External ear normal.     Left Ear: External ear normal.     Nose:     Comments: Turbinates are erythematous    Mouth/Throat:     Mouth: Mucous membranes are moist.     Pharynx: Oropharynx is clear.     Comments: Significant amount of clear drainage of posterior oropharynx noted Eyes:     Extraocular Movements: Extraocular movements intact.     Conjunctiva/sclera: Conjunctivae normal.     Pupils: Pupils are equal, round, and reactive to light.  Cardiovascular:     Rate and Rhythm: Normal rate and regular rhythm.     Pulses: Normal pulses.     Heart sounds: Normal heart sounds. No murmur heard. Pulmonary:     Effort: Pulmonary effort is normal.     Breath sounds: No wheezing,  rhonchi or rales.     Comments: Diminished breath  sounds noted throughout with frequent cough on exam Musculoskeletal:        General: Normal range of motion.     Cervical back: Normal range of motion and neck supple.  Skin:    General: Skin is warm.  Neurological:     General: No focal deficit present.     Mental Status: She is alert and oriented to person, place, and time. Mental status is at baseline.      UC Treatments / Results  Labs (all labs ordered are listed, but only abnormal results are displayed) Labs Reviewed - No data to display  EKG   Radiology DG Chest 2 View  Result Date: 02/07/2023 CLINICAL DATA:  47 year old female with history of cough for the past 3 weeks. EXAM: CHEST - 2 VIEW COMPARISON:  No priors. FINDINGS: Lung volumes are normal. No consolidative airspace disease. No pleural effusions. No pneumothorax. No pulmonary nodule or mass noted. Pulmonary vasculature and the cardiomediastinal silhouette are within normal limits. IMPRESSION: No radiographic evidence of acute cardiopulmonary disease. Electronically Signed   By: Trudie Reedaniel  Entrikin M.D.   On: 02/07/2023 10:00    Procedures Procedures (including critical care time)  Medications Ordered in UC Medications - No data to display  Initial Impression / Assessment and Plan / UC Course  I have reviewed the triage vital signs and the nursing notes.  Pertinent labs & imaging results that were available during my care of the patient were reviewed by me and considered in my medical decision making (see chart for details).     MDM: 1.  Cough, unspecified type-CXR revealed above, patient advised of chest x-ray results with hardcopy and images provided prior to discharge Rx'd Doxycycline 100 mg twice daily x 10 days, Tessalon 200 mg 3 times daily, as needed, Promethazine DM 6.25-15 mg per 5 mL take 5 mL twice daily for cough; 2.  Bronchitis-R neck Sterapred Unipak (tapering from 60 mg to 10 mg over 10 days); 3.  Allergic rhinitis-Rx'd Allegra 180 mg daily x 5 days  then as needed. Advised patient to take medications as directed with food to completion.  Advised patient to take Allegra and Prednisone with first dose of Doxycycline for the next 10 days.  Advised may discontinue Allegra after 5 days and use as needed for concurrent postnasal drainage/drip.  Advised may use Tessalon during the day, as needed for cough.  Advised may use Promethazine DM at night prior to sleep for cough due to sedative effects.  Encouraged increase daily water intake to 64 ounces per day while taking these medications.  Advised if symptoms worsen and/or unresolved please follow-up with PCP or here for further evaluation. Final Clinical Impressions(s) / UC Diagnoses   Final diagnoses:  Cough, unspecified type  Bronchitis  Allergic rhinitis, unspecified seasonality, unspecified trigger     Discharge Instructions      Advised patient to take medications as directed with food to completion.  Advised patient to take Allegra and Prednisone with first dose of Doxycycline for the next 10 days.  Advised may discontinue Allegra after 5 days and use as needed for concurrent postnasal drainage/drip.  Advised may use Tessalon during the day, as needed for cough.  Advised may use Promethazine DM at night prior to sleep for cough due to sedative effects.  Encouraged increase daily water intake to 64 ounces per day while taking these medications.  Advised if symptoms worsen and/or unresolved please follow-up  with PCP or here for further evaluation.     ED Prescriptions     Medication Sig Dispense Auth. Provider   doxycycline (VIBRAMYCIN) 100 MG capsule Take 1 capsule (100 mg total) by mouth 2 (two) times daily for 10 days. 20 capsule Trevor Iha, FNP   predniSONE (STERAPRED UNI-PAK 21 TAB) 10 MG (21) TBPK tablet Take by mouth daily. Take 6 tabs by mouth daily  for 2 days, then 5 tabs for 2 days, then 4 tabs for 2 days, then 3 tabs for 2 days, 2 tabs for 2 days, then 1 tab by mouth daily for  2 days 42 tablet Trevor Iha, FNP   fexofenadine [facility] ALLERGY) 180 MG tablet Take 1 tablet (180 mg total) by mouth daily for 15 days. 15 tablet Trevor Iha, FNP   benzonatate (TESSALON) 200 MG capsule Take 1 capsule (200 mg total) by mouth 3 (three) times daily as needed for up to 7 days. 40 capsule Trevor Iha, FNP   promethazine-dextromethorphan (PROMETHAZINE-DM) 6.25-15 MG/5ML syrup Take 5 mLs by mouth 2 (two) times daily as needed for cough. 118 mL Trevor Iha, FNP      PDMP not reviewed this encounter.   Trevor Iha, FNP 02/07/23 1027

## 2023-02-07 NOTE — ED Triage Notes (Signed)
Cough since Wednesday  Pt was seen on Monday by PCP for  post nasal gtt Coughing is so bad she is vomiting and has been incontinent of urine OTC  alka seltzer cold & sinus & flonase No fevers Pt had bronchitis recently  Pt was given an inhaler at PCP appt but has not started due to side effects

## 2023-02-07 NOTE — Discharge Instructions (Addendum)
Advised patient to take medications as directed with food to completion.  Advised patient to take Allegra and Prednisone with first dose of Doxycycline for the next 10 days.  Advised may discontinue Allegra after 5 days and use as needed for concurrent postnasal drainage/drip.  Advised may use Tessalon during the day, as needed for cough.  Advised may use Promethazine DM at night prior to sleep for cough due to sedative effects.  Encouraged increase daily water intake to 64 ounces per day while taking these medications.  Advised if symptoms worsen and/or unresolved please follow-up with PCP or here for further evaluation.

## 2023-02-08 ENCOUNTER — Telehealth: Payer: Self-pay | Admitting: Family Medicine

## 2023-02-08 ENCOUNTER — Telehealth: Payer: Self-pay | Admitting: Emergency Medicine

## 2023-02-08 MED ORDER — HYDROCODONE BIT-HOMATROP MBR 5-1.5 MG/5ML PO SOLN: 5.0000 mL | Freq: Four times a day (QID) | ORAL | 0 refills | Status: AC | PRN

## 2023-02-08 NOTE — Telephone Encounter (Signed)
Patient calls to report that Promethazine DM and Tessalon have provided no relief for her cough.  Have prescribed Hycodan and sent to pharmacy per patient request.

## 2023-02-08 NOTE — Telephone Encounter (Signed)
Call back to patient regarding ongoing cough. Pt requesting an alternate medication. Chart reviewed w/ provider. Trevor Iha, NP will send in hycodan to pharmacy on record. RN also reviewed w/ pt how to administer flonase. Per provider, pt may use afrin OTC for nose bleeds prior to  using saline irrigation &  flonase. If symptoms do not improve, pt is to follow up in the ED for a higher level of care and imaging. No other questions at this time.
